# Patient Record
Sex: Male | Born: 1995 | Race: Black or African American | Hispanic: No | Marital: Single | Smoking: Never smoker
Health system: Southern US, Community
[De-identification: ages and names within clinical notes are randomized; demographics above are authoritative.]

## PROBLEM LIST (undated history)

## (undated) HISTORY — PX: TESTICLE REMOVAL: SHX68

---
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
## 2017-08-18 ENCOUNTER — Emergency Department (HOSPITAL_COMMUNITY): Payer: Managed Care, Other (non HMO)

## 2017-08-18 ENCOUNTER — Other Ambulatory Visit: Payer: Self-pay

## 2017-08-18 ENCOUNTER — Emergency Department (HOSPITAL_COMMUNITY)
Admission: EM | Admit: 2017-08-18 | Discharge: 2017-08-18 | Disposition: A | Discharge status: Home / Self Care | Payer: Managed Care, Other (non HMO) | Attending: Emergency Medicine | Admitting: Emergency Medicine

## 2017-08-18 ENCOUNTER — Encounter (HOSPITAL_COMMUNITY): Payer: Self-pay | Admitting: Emergency Medicine

## 2017-08-18 DIAGNOSIS — X501XXA Overexertion from prolonged static or awkward postures, initial encounter: Secondary | ICD-10-CM | POA: Insufficient documentation

## 2017-08-18 DIAGNOSIS — Y9367 Activity, basketball: Secondary | ICD-10-CM | POA: Insufficient documentation

## 2017-08-18 DIAGNOSIS — S8991XA Unspecified injury of right lower leg, initial encounter: Secondary | ICD-10-CM | POA: Insufficient documentation

## 2017-08-18 DIAGNOSIS — Y92310 Basketball court as the place of occurrence of the external cause: Secondary | ICD-10-CM | POA: Insufficient documentation

## 2017-08-18 DIAGNOSIS — Y998 Other external cause status: Secondary | ICD-10-CM | POA: Diagnosis not present

## 2017-08-18 MED ORDER — HYDROCODONE-ACETAMINOPHEN 5-325 MG PO TABS: 1.0000 | ORAL_TABLET | Freq: Four times a day (QID) | ORAL | 0 refills | Status: AC | PRN

## 2017-08-18 NOTE — ED Notes (Signed)
Consulting MD at bedside

## 2017-08-18 NOTE — Progress Notes (Signed)
Orthopedic Tech Progress Note Patient Details:  Geryl CouncilmanJulian Dowse 08-12-1995 [id number]  Ortho Devices Type of Ortho Device: Crutches, Knee Immobilizer Ortho Device/Splint Location: rle Ortho Device/Splint Interventions: Ordered, Application, Adjustment   Post Interventions Patient Tolerated: Well Instructions Provided: Care of device, Adjustment of device   Trinna PostMartinez, Corene Resnick J 08/18/2017, 10:16 PM

## 2017-08-18 NOTE — Discharge Instructions (Signed)
Use the knee immobilizer at all time.  You may walk with a knee immobilizer on, or use crutches as needed for pain. Ice your knee for 20 minutes at a time at least 3 times a day. Take ibuprofen 3 times a day with meals.  Do not take other anti-inflammatories at the same time open (Advil, Motrin, naproxen, Aleve).  You may take norco as needed for severe pain. Do not drive or operate heavy machinery while taking this medication.  Call Dr. Aundria Rudogers office tomorrow to set up an appointment for later this week. Return to the emergency room if you develop numbness, worsening pain, or any new or concerning symptoms.

## 2017-08-18 NOTE — ED Notes (Signed)
Pt back from x-ray.

## 2017-08-18 NOTE — ED Notes (Signed)
Pt understood dc material. NAD noted. Scripts given at dc 

## 2017-08-18 NOTE — ED Triage Notes (Addendum)
C/o R knee injury since being fit in back of leg and falling forward around 5:30pm while playing basketball.  Pain 2/10 but 10/10 with movement.  Unable to bear weight.  Ice applied at triage.

## 2017-08-18 NOTE — ED Provider Notes (Signed)
[facility]   CSN: [id number] Arrival date & time: 08/18/17  1837     History   Chief Complaint Chief Complaint  Patient presents with  . Knee Injury    HPI Jason Peck is a 22 y.o. male presenting for evaluation of right knee pain.  Patient states he was playing basketball when he went to jump up, and had acute onset of anterior right knee pain after hearing a pop.  He subsequently fell to the ground.  Was unable to walk after the injury.  Reports sharp pain with any movement of the right leg. No radiation of the pain.  He denies injury of the knee before.  He denies numbness or tingling.  He has no pain at rest.  He has not had anything for pain including Tylenol or ibuprofen.  He has no other medical problems, is not on blood thinners.  He does not have an orthopedic doctor.  HPI  History reviewed. No pertinent past medical history.  There are no active problems to display for this patient.   Past Surgical History:  Procedure Laterality Date  . TESTICLE REMOVAL         Home Medications    Prior to Admission medications   Medication Sig Start Date End Date Taking? Authorizing Provider  HYDROcodone-acetaminophen (NORCO/VICODIN) 5-325 MG tablet Take 1 tablet by mouth every 6 (six) hours as needed for severe pain. 08/18/17   Skarlett Sedlacek, PA-C    Family History No family history on file.  Social History Social History   Tobacco Use  . Smoking status: Never Smoker  . Smokeless tobacco: Never Used  Substance Use Topics  . Alcohol use: No    Frequency: Never  . Drug use: No     Allergies   Patient has no allergy information on record.   Review of Systems Review of Systems  Musculoskeletal: Positive for arthralgias and joint swelling.  All other systems reviewed and are negative.   Physical Exam Updated Vital Signs BP (!) 146/64   Pulse (!) 104   Temp 99.8 F (37.7 C) (Oral)   Resp 14    Ht 5\' 7"  (1.702 m)   Wt 59 kg (130 lb)   SpO2 100%   BMI 20.36 kg/m   Physical Exam  Constitutional: He is oriented to person, place, and time. He appears well-developed and well-nourished. No distress.  HENT:  Head: Normocephalic and atraumatic.  Eyes: EOM are normal.  Neck: Normal range of motion.  Pulmonary/Chest: Effort normal.  Abdominal: He exhibits no distension.  Musculoskeletal: He exhibits edema, tenderness and deformity.  Obvious swelling of the right knee.  Tenderness to palpation of anterior right knee and joint line.  Patella high riding.  No tenderness to palpation of medial or lateral collateral ligaments.  No tenderness palpation of the posterior knee.  No tenderness palpation of the right quadriceps or calf.  Sensation of lower extremity is intact bilaterally.  Pedal pulses equal bilaterally.  Soft compartments.  Able to assess if pt can extend knee due to pain.   Neurological: He is alert and oriented to person, place, and time. No sensory deficit.  Skin: Skin is warm. No rash noted.  Psychiatric: He has a normal mood and affect.  Nursing note and vitals reviewed.    ED Treatments / Results  Labs (all labs ordered are listed, but only abnormal results are displayed) Labs Reviewed - No data to display  EKG  EKG Interpretation  None       Radiology Dg Knee Complete 4 Views Right  Result Date: 08/18/2017 CLINICAL DATA:  Acute RIGHT knee pain following fall today. Initial encounter. EXAM: RIGHT KNEE - COMPLETE 4+ VIEW COMPARISON:  None. FINDINGS: High riding patella and swelling in the infrapatellar soft tissues noted compatible with patellar tendon injury rupture. There is no evidence of bony fragment/fracture. No other significant abnormalities are noted. IMPRESSION: High riding patella with anterior soft tissue swelling compatible with patellar tendon injury/rupture. Electronically Signed   By: Harmon Pier M.D.   On: 08/18/2017 19:42    Procedures Procedures  (including critical care time)  Medications Ordered in ED Medications - No data to display   Initial Impression / Assessment and Plan / ED Course  I have reviewed the triage vital signs and the nursing notes.  Pertinent labs & imaging results that were available during my care of the patient were reviewed by me and considered in my medical decision making (see chart for details).     Patient presenting for evaluation of right knee injury.  Physical exam shows swelling and pain of the right knee.  He is neurovascularly intact.  X-ray shows no fracture or dislocation.  Likely patellar tendon rupture.  Will consult with orthopedics for management and follow-up.  Discussed with Dr. Aundria Rud from orthopedics.  Patient will require MRI, either in the ER or outpatient.  Will place patient in knee immobilizer, weight-bear as tolerated.  Crutches given.  Scheduled NSAIDs with Norco as needed for severe pain.  Patient to follow-up with Dr. Aundria Rud this week for further management.  Called MRI, and there is approximately an 8+ hour wait for MRI.  As his sxs are nonemergent, pt to get outpatient MRI ordered by Dr. Aundria Rud.  At this time, patient appears safe for discharge.  Return precautions given.  Patient states he understands and agrees to plan.   Final Clinical Impressions(s) / ED Diagnoses   Final diagnoses:  Injury of right knee, initial encounter    ED Discharge Orders        Ordered    HYDROcodone-acetaminophen (NORCO/VICODIN) 5-325 MG tablet  Every 6 hours PRN     08/18/17 2133       Alveria Apley, PA-C 08/19/17 0111    Doug Sou, MD 08/19/17 1505

## 2017-08-18 NOTE — ED Notes (Signed)
Pt to xray

## 2018-08-01 ENCOUNTER — Ambulatory Visit (INDEPENDENT_AMBULATORY_CARE_PROVIDER_SITE_OTHER): Payer: Managed Care, Other (non HMO)

## 2018-08-01 ENCOUNTER — Ambulatory Visit (HOSPITAL_COMMUNITY)
Admission: EM | Admit: 2018-08-01 | Discharge: 2018-08-01 | Disposition: A | Discharge status: Home / Self Care | Payer: Managed Care, Other (non HMO) | Attending: Internal Medicine | Admitting: Internal Medicine

## 2018-08-01 ENCOUNTER — Encounter (HOSPITAL_COMMUNITY): Payer: Self-pay | Admitting: Emergency Medicine

## 2018-08-01 DIAGNOSIS — S83412A Sprain of medial collateral ligament of left knee, initial encounter: Secondary | ICD-10-CM | POA: Diagnosis not present

## 2018-08-01 MED ORDER — IBUPROFEN 400 MG PO TABS: 400.0000 mg | ORAL_TABLET | Freq: Four times a day (QID) | ORAL | 0 refills | Status: AC | PRN

## 2018-08-01 NOTE — ED Triage Notes (Signed)
Pt states yesterday he was playing basketball and did a layup and jumped off his left leg and felt a pop in his L knee. C/o ongoing L knee pain.

## 2018-08-01 NOTE — ED Provider Notes (Signed)
[facility]    CSN: [id number] Arrival date & time: 08/01/18  1112     History   Chief Complaint Chief Complaint  Patient presents with  . Knee Pain    HPI Jason Peck is a 23 y.o. male with no past medical history comes to the urgent care for left knee pain started yesterday.  Patient was playing basketball when he started having pain following a lay up/jump maneuver.  Patient felt a pop in his left knee.  There was no swelling.  He was able to bear some weight on the left knee.   HPI  History reviewed. No pertinent past medical history.  There are no active problems to display for this patient.   Past Surgical History:  Procedure Laterality Date  . TESTICLE REMOVAL         Home Medications    Prior to Admission medications   Medication Sig Start Date End Date Taking? Authorizing Provider  HYDROcodone-acetaminophen (NORCO/VICODIN) 5-325 MG tablet Take 1 tablet by mouth every 6 (six) hours as needed for severe pain. Patient not taking: Reported on 08/01/2018 08/18/17   Alveria Apley, PA-C    Family History No family history on file.  Social History Social History   Tobacco Use  . Smoking status: Never Smoker  . Smokeless tobacco: Never Used  Substance Use Topics  . Alcohol use: No    Frequency: Never  . Drug use: No     Allergies   Patient has no known allergies.   Review of Systems Review of Systems  Gastrointestinal: Negative for abdominal distention and abdominal pain.  Musculoskeletal: Positive for arthralgias. Negative for back pain, gait problem, joint swelling and myalgias.  Neurological: Negative for dizziness and headaches.     Physical Exam Triage Vital Signs ED Triage Vitals  Enc Vitals Group     BP 08/01/18 1205 (!) 120/53     Pulse Rate 08/01/18 1205 63     Resp 08/01/18 1205 14     Temp 08/01/18 1205 98.7 F (37.1 C)     Temp src --      SpO2 08/01/18 1205 100 %     Weight --      Height --      Head  Circumference --      Peak Flow --      Pain Score 08/01/18 1207 0     Pain Loc --      Pain Edu? --      Excl. in GC? --    No data found.  Updated Vital Signs BP (!) 120/53   Pulse 63   Temp 98.7 F (37.1 C)   Resp 14   SpO2 100%   Visual Acuity Right Eye Distance:   Left Eye Distance:   Bilateral Distance:    Right Eye Near:   Left Eye Near:    Bilateral Near:     Physical Exam Musculoskeletal: Normal range of motion.        General: No swelling, tenderness or signs of injury.  Skin:    General: Skin is warm and dry.     Capillary Refill: Capillary refill takes less than 2 seconds.  Neurological:     General: No focal deficit present.     Mental Status: He is oriented to person, place, and time.     Cranial Nerves: No cranial nerve deficit.     Sensory: No sensory deficit.     Motor: No weakness.  Coordination: Coordination normal.     Gait: Gait normal.     Deep Tendon Reflexes: Reflexes normal.      UC Treatments / Results  Labs (all labs ordered are listed, but only abnormal results are displayed) Labs Reviewed - No data to display  EKG None  Radiology Dg Knee Complete 4 Views Left  Result Date: 08/01/2018 CLINICAL DATA:  Left knee pain following a fall yesterday. EXAM: LEFT KNEE - COMPLETE 4+ VIEW COMPARISON:  None. FINDINGS: No evidence of fracture, dislocation, or joint effusion. No evidence of arthropathy or other focal bone abnormality. Soft tissues are unremarkable. IMPRESSION: Negative. Electronically Signed   By: Beckie Salts M.D.   On: 08/01/2018 12:42    Procedures Procedures (including critical care time)  Medications Ordered in UC Medications - No data to display  Initial Impression / Assessment and Plan / UC Course  I have reviewed the triage vital signs and the nursing notes.  Pertinent labs & imaging results that were available during my care of the patient were reviewed by me and considered in my medical decision making (see  chart for details).     1.  Left knee sprain likely medial collateral ligament: NSAIDs Patient advised not to return to playing basketball for the next few days Icing of the knee Gentle range of motion exercises. Education none left knee sprain was given to the patient. Final Clinical Impressions(s) / UC Diagnoses   Final diagnoses:  None   Discharge Instructions   None    ED Prescriptions    None     Controlled Substance Prescriptions [place] Controlled Substance Registry consulted? No   Merrilee Jansky, MD 08/01/18 [phone]

## 2019-06-21 IMAGING — DX DG KNEE COMPLETE 4+V*L*
5 series · 5 of 5 positions shown · non-contrast
Comparison: None.

CLINICAL DATA: Left knee pain following a fall yesterday.

EXAM:
LEFT KNEE - COMPLETE 4+ VIEW

[knee ap]
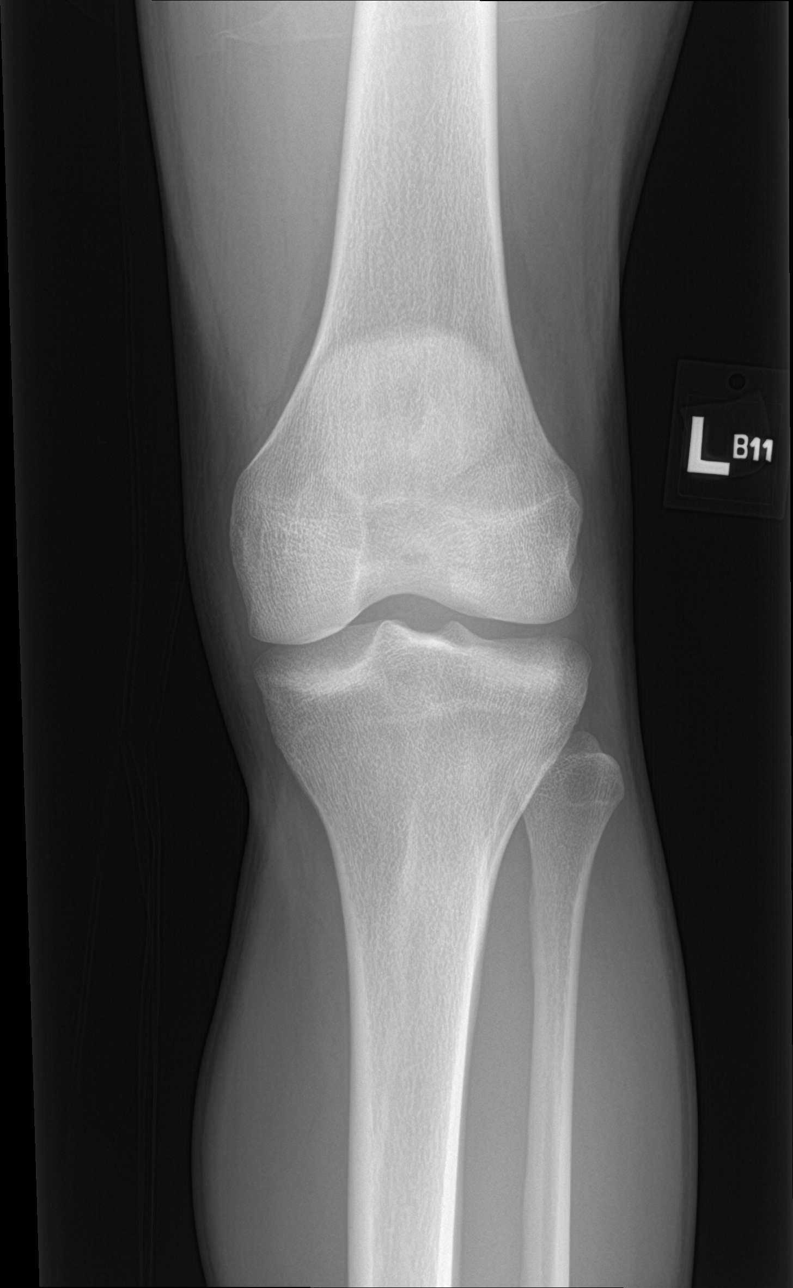

[knee obl (1 of 2)]
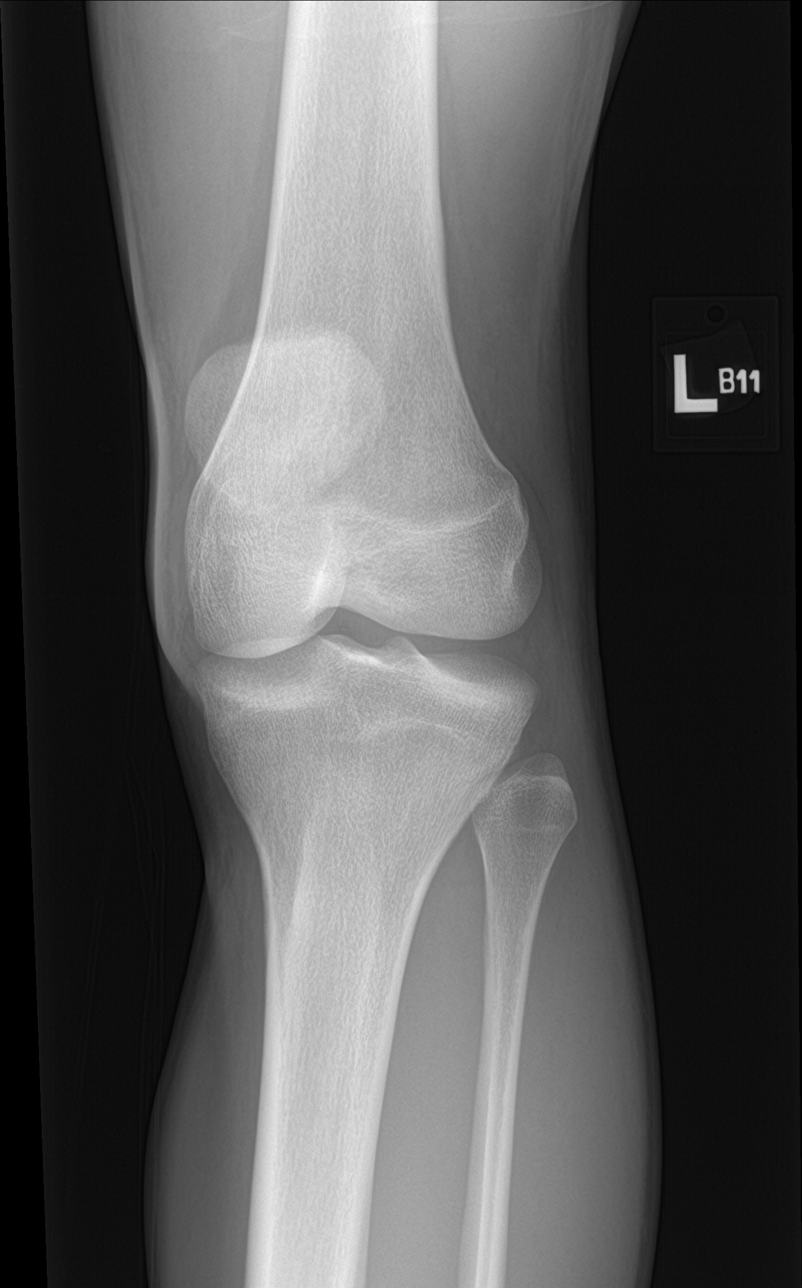

[knee obl (2 of 2)]
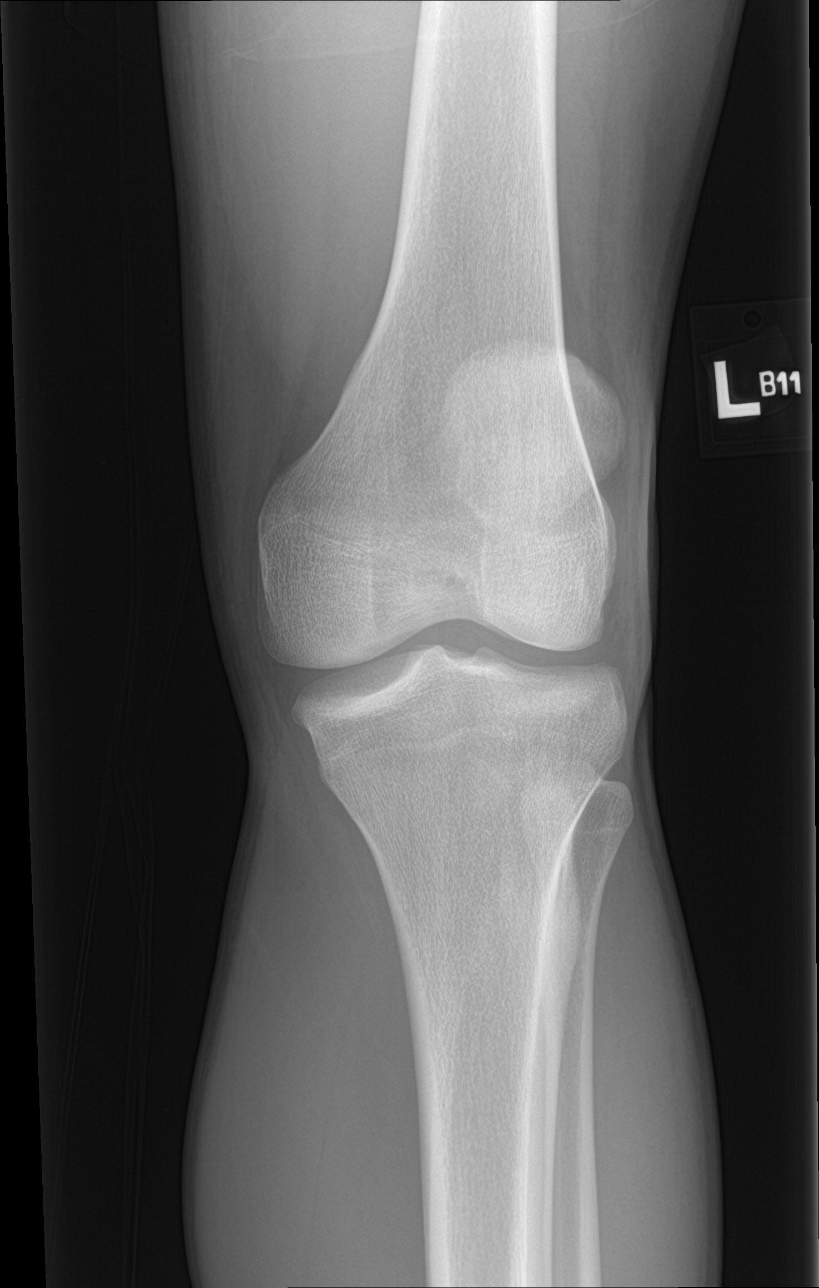

[knee lat]
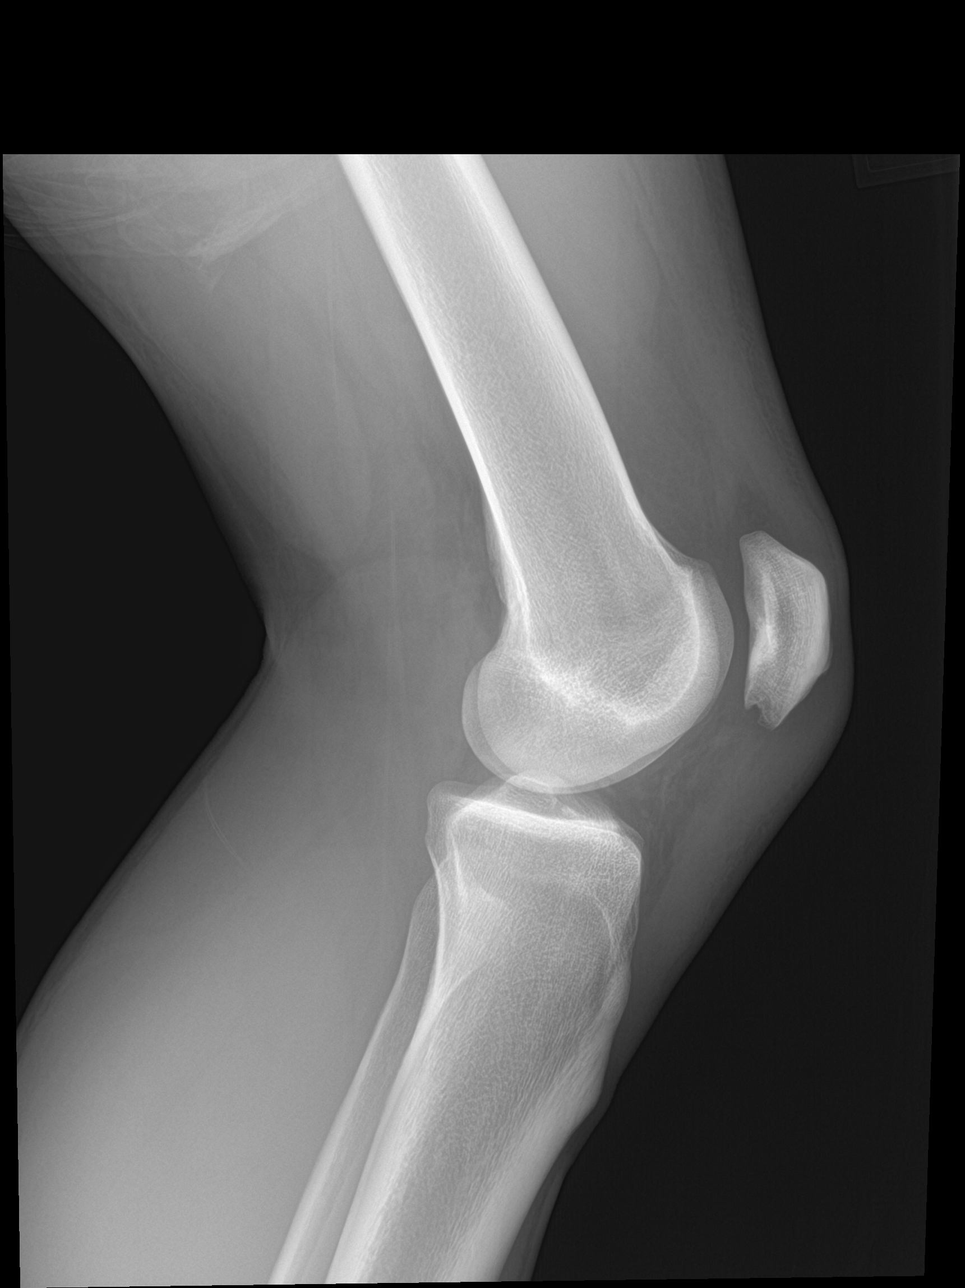

[knee sunrise]
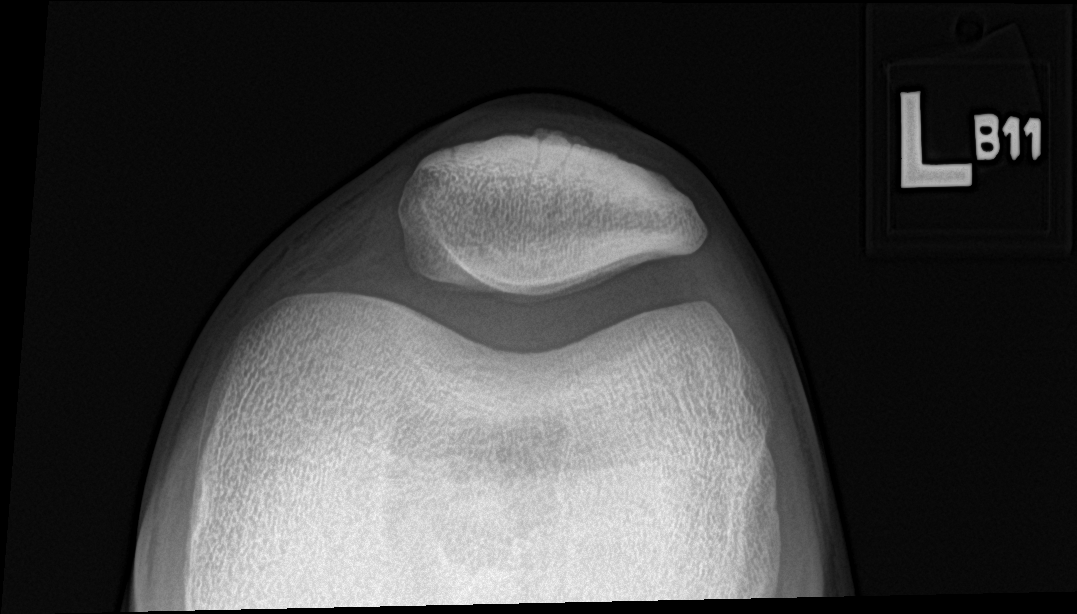

[5 of 5 positions shown; findings below may reference images not displayed]

FINDINGS: No evidence of fracture, dislocation, or joint effusion. No evidence
of arthropathy or other focal bone abnormality. Soft tissues are
unremarkable.
IMPRESSION: Negative.

## 2019-09-24 ENCOUNTER — Ambulatory Visit: Payer: Managed Care, Other (non HMO) | Attending: Family

## 2019-09-24 DIAGNOSIS — Z23 Encounter for immunization: Secondary | ICD-10-CM

## 2019-09-24 NOTE — Progress Notes (Signed)
   Covid-19 Vaccination Clinic  Name:  Jason Peck    MRN: [id number] DOB: 1995-07-03  09/24/2019  Mr. Phommachanh was observed post Covid-19 immunization for 15 minutes without incident. He was provided with Vaccine Information Sheet and instruction to access the V-Safe system.   Mr. Keisler was instructed to call 911 with any severe reactions post vaccine: Marland Kitchen Difficulty breathing  . Swelling of face and throat  . A fast heartbeat  . A bad rash all over body  . Dizziness and weakness   Immunizations Administered    Name Date Dose VIS Date Route   Moderna COVID-19 Vaccine 09/24/2019  1:48 PM 0.5 mL 05/26/2019 Intramuscular   Manufacturer: Moderna   Lot: [id number]   NDC: 79987-215-87

## 2019-10-27 ENCOUNTER — Ambulatory Visit: Payer: Managed Care, Other (non HMO) | Attending: Family

## 2019-10-27 ENCOUNTER — Ambulatory Visit: Payer: Managed Care, Other (non HMO)

## 2019-10-27 DIAGNOSIS — Z23 Encounter for immunization: Secondary | ICD-10-CM

## 2019-10-27 NOTE — Progress Notes (Signed)
   Covid-19 Vaccination Clinic  Name:  Jason Peck    MRN: [id number] DOB: January 19, 1996  10/27/2019  Mr. Montoya was observed post Covid-19 immunization for 15 minutes without incident. He was provided with Vaccine Information Sheet and instruction to access the V-Safe system.   Mr. Bowsher was instructed to call 911 with any severe reactions post vaccine: Marland Kitchen Difficulty breathing  . Swelling of face and throat  . A fast heartbeat  . A bad rash all over body  . Dizziness and weakness   Immunizations Administered    Name Date Dose VIS Date Route   Moderna COVID-19 Vaccine 10/27/2019  2:31 PM 0.5 mL 05/2019 Intramuscular   Manufacturer: Moderna   Lot: [id number]   NDC: 50413-643-83
